# Patient Record
Sex: Female | Born: 1995 | Race: White | Hispanic: No | Marital: Single | Smoking: Never smoker
Health system: Southern US, Community
[De-identification: ages and names within clinical notes are randomized; demographics above are authoritative.]

---
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
## 2017-06-06 ENCOUNTER — Ambulatory Visit: Admitting: Family Medicine

## 2017-12-03 ENCOUNTER — Ambulatory Visit: Admitting: Pediatrics

## 2017-12-07 LAB — HX SEXUALLY TRAN DIS (AMP PRB)
CASE NUMBER: 2018354002739
HX C TRACHOMATIS DNA: NOT DETECTED — NL
HX N. GONORRHOEAE DNA: NOT DETECTED — NL
HX TOTAL RLU: 10

## 2018-05-15 ENCOUNTER — Ambulatory Visit: Admitting: Physician Assistant

## 2018-09-03 ENCOUNTER — Other Ambulatory Visit: Payer: Self-pay | Admitting: Sports Medicine

## 2018-09-03 DIAGNOSIS — S93401S Sprain of unspecified ligament of right ankle, sequela: Secondary | ICD-10-CM

## 2018-09-03 DIAGNOSIS — M25471 Effusion, right ankle: Secondary | ICD-10-CM

## 2018-09-03 DIAGNOSIS — G8929 Other chronic pain: Secondary | ICD-10-CM

## 2018-09-03 DIAGNOSIS — M25571 Pain in right ankle and joints of right foot: Principal | ICD-10-CM

## 2018-09-11 ENCOUNTER — Encounter: Payer: Self-pay | Admitting: Emergency Medicine

## 2018-09-11 ENCOUNTER — Other Ambulatory Visit: Payer: Self-pay

## 2018-09-11 ENCOUNTER — Emergency Department
Admission: EM | Admit: 2018-09-11 | Discharge: 2018-09-11 | Disposition: A | Discharge status: Home / Self Care | Payer: 59 | Attending: Emergency Medicine | Admitting: Emergency Medicine

## 2018-09-11 DIAGNOSIS — Y92002 Bathroom of unspecified non-institutional (private) residence single-family (private) house as the place of occurrence of the external cause: Secondary | ICD-10-CM | POA: Insufficient documentation

## 2018-09-11 DIAGNOSIS — S61512A Laceration without foreign body of left wrist, initial encounter: Secondary | ICD-10-CM

## 2018-09-11 DIAGNOSIS — W268XXA Contact with other sharp object(s), not elsewhere classified, initial encounter: Secondary | ICD-10-CM | POA: Diagnosis not present

## 2018-09-11 DIAGNOSIS — Y999 Unspecified external cause status: Secondary | ICD-10-CM | POA: Insufficient documentation

## 2018-09-11 DIAGNOSIS — Y93E1 Activity, personal bathing and showering: Secondary | ICD-10-CM | POA: Insufficient documentation

## 2018-09-11 NOTE — Discharge Instructions (Addendum)
Please follow-up with [facility] in 7 to 10 days for suture removal.  Return to the emergency department for any signs of infection such as increased pain, pus or fever.

## 2018-09-11 NOTE — ED Notes (Signed)
ED Provider at bedside. 

## 2018-09-11 NOTE — ED Provider Notes (Signed)
[facility] Emergency Department Provider Note  Time seen: 4:28 AM  I have reviewed the triage vital signs and the nursing notes.   HISTORY  Chief Complaint Laceration    HPI Nicole Miranda is a 22 y.o. female with no past medical history who presents to the emergency department with a laceration to her left wrist.  According to the patient she was in the shower when she was trying to wash out her razor accidentally cutting herself in the left wrist patient has approximately 1.5 cm laceration to the left wrist with continued mild venous oozing.  No other lacerations noted.  Patient adamantly denies any SI or HI, states she realizes it is in a suspicious place but states it was 100% an accident.   History reviewed. No pertinent past medical history.  There are no active problems to display for this patient.   History reviewed. No pertinent surgical history.  Prior to Admission medications   Not on File    No Known Allergies  No family history on file.  Social History Social History   Tobacco Use  . Smoking status: Never Smoker  . Smokeless tobacco: Never Used  Substance Use Topics  . Alcohol use: Yes  . Drug use: Never    Review of Systems Constitutional: Negative for fever. Cardiovascular: Negative for chest pain. Respiratory: Negative for shortness of breath. Gastrointestinal: Negative for abdominal pain Skin: 1.5 cm laceration to the left wrist All other ROS negative  ____________________________________________   PHYSICAL EXAM:  VITAL SIGNS: ED Triage Vitals  Enc Vitals Group     BP 09/11/18 0259 121/75     Pulse Rate 09/11/18 0259 (!) 105     Resp 09/11/18 0259 18     Temp 09/11/18 0259 97.9 F (36.6 C)     Temp Source 09/11/18 0259 Oral     SpO2 09/11/18 0259 100 %     Weight 09/11/18 0258 145 lb (65.8 kg)     Height 09/11/18 0258 5\' 7"  (1.702 m)     Head Circumference --      Peak Flow --      Pain Score 09/11/18  0258 2     Pain Loc --      Pain Edu? --      Excl. in GC? --     Constitutional: Alert and oriented. Well appearing and in no distress. Eyes: Normal exam ENT   Head: Normocephalic and atraumatic.   Mouth/Throat: Mucous membranes are moist. Cardiovascular: Normal rate, regular rhythm. No murmur Respiratory: Normal respiratory effort without tachypnea nor retractions. Breath sounds are clear  Gastrointestinal: Soft and nontender. No distention.   Musculoskeletal: Nontender with normal range of motion in all extremities. Neurologic:  Normal speech and language. No gross focal neurologic deficits  Skin: 1.5 cm laceration to the left wrist, minimal venous oozing. Psychiatric: Mood and affect are normal. Speech and behavior are normal.   ____________________________________________   INITIAL IMPRESSION / ASSESSMENT AND PLAN / ED COURSE  Pertinent labs & imaging results that were available during my care of the patient were reviewed by me and considered in my medical decision making (see chart for details).  Patient presents to the emergency department with a laceration of the left wrist.  Patient denies any SI, denies self-mutilation.  No other scars present.  Does not appear suspicious for self-harm.  Patient's tetanus shot is up-to-date.  Laceration repaired by myself with 4 sutures which will be removed in 7 days at [facility] per patient.  LACERATION REPAIR Performed by: Minna Antis Authorized by: Minna Antis Consent: Verbal consent obtained. Risks and benefits: risks, benefits and alternatives were discussed Consent given by: patient Patient identity confirmed: provided demographic data Prepped and Draped in normal sterile fashion Wound explored  Laceration Location: Left wrist  Laceration Length: 2 cm  No Foreign Bodies seen or palpated  Anesthesia: local infiltration  Local anesthetic: lidocaine 1 % without epinephrine  Anesthetic total: 3  ml  Irrigation method: syringe Amount of cleaning: standard  Skin closure: 4-0 Prolene  Number of sutures: 4  Technique: Simple interrupted  Patient tolerance: Patient tolerated the procedure well with no immediate complications.   ____________________________________________   FINAL CLINICAL IMPRESSION(S) / ED DIAGNOSES  Laceration    Minna Antis, MD 09/11/18 [phone]

## 2018-09-11 NOTE — ED Triage Notes (Signed)
Pt arrives ambulatory with steady gait to triage with c/o left wrist laceration. Pt states that she was cleaning her razor in the shower and cut herself on accident. Pt is in NAD.

## 2018-09-16 ENCOUNTER — Ambulatory Visit
Admission: RE | Admit: 2018-09-16 | Discharge: 2018-09-16 | Disposition: A | Discharge status: Home / Self Care | Payer: 59 | Source: Ambulatory Visit | Attending: Sports Medicine | Admitting: Sports Medicine

## 2018-09-16 DIAGNOSIS — X58XXXS Exposure to other specified factors, sequela: Secondary | ICD-10-CM | POA: Insufficient documentation

## 2018-09-16 DIAGNOSIS — M25471 Effusion, right ankle: Secondary | ICD-10-CM | POA: Insufficient documentation

## 2018-09-16 DIAGNOSIS — G8929 Other chronic pain: Secondary | ICD-10-CM | POA: Diagnosis not present

## 2018-09-16 DIAGNOSIS — M25571 Pain in right ankle and joints of right foot: Secondary | ICD-10-CM | POA: Insufficient documentation

## 2018-09-16 DIAGNOSIS — S93401S Sprain of unspecified ligament of right ankle, sequela: Secondary | ICD-10-CM | POA: Insufficient documentation

## 2018-12-03 ENCOUNTER — Ambulatory Visit: Admitting: Pediatrics

## 2018-12-06 LAB — HX SEXUALLY TRAN DIS (AMP PRB)
CASE NUMBER: 2019354003274
HX C TRACHOMATIS DNA: NOT DETECTED — NL
HX N. GONORRHOEAE DNA: NOT DETECTED — NL
HX TOTAL RLU: 7

## 2019-06-04 IMAGING — MR MR ANKLE*R* W/O CM
5 series · 40 of 40 positions shown · non-contrast
Comparison: None.

CLINICAL DATA: Right ankle pain since a twisting injury when the
patient fell down stairs in [REDACTED] Initial encounter.

EXAM:
MRI OF THE RIGHT ANKLE WITHOUT CONTRAST
TECHNIQUE: Multiplanar, multisequence MR imaging of the ankle was performed. No
intravenous contrast was administered.

[Series 3: PD fat-sat · axial · 3.0mm · 0.70mm/px · z∈[-110,+28]mm · 9 of 43 slices shown]
[im 1/43]
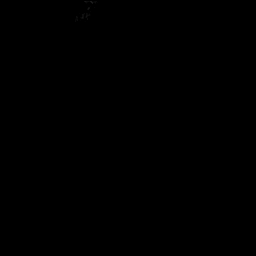
[im 6/43]
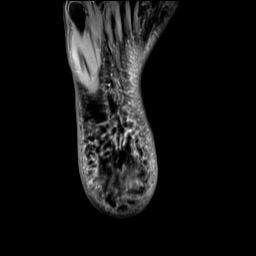
[im 11/43]
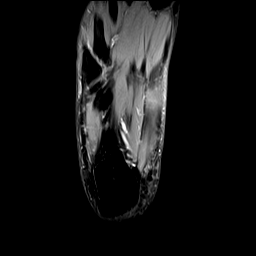
[im 16/43]
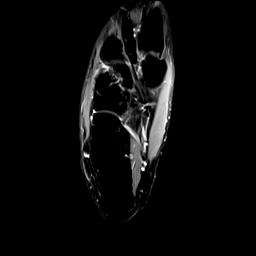
[im 22/43]
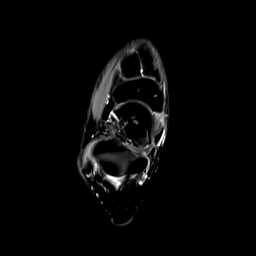
[im 27/43]
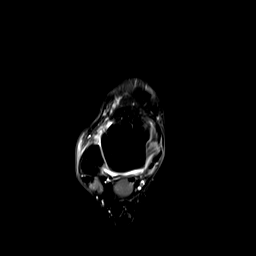
[im 32/43]
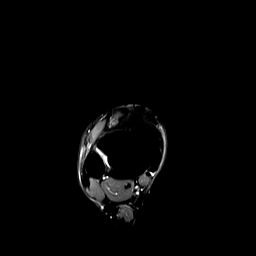
[im 37/43]
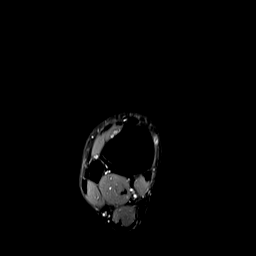
[im 43/43]
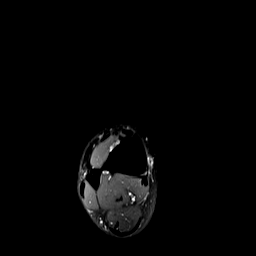

[Series 4: T2 fat-sat · axial · 3.0mm · 0.70mm/px · z∈[-110,+28]mm · 9 of 43 slices shown (1 of 2)]
[im 1/43]
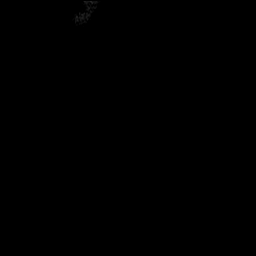
[im 6/43]
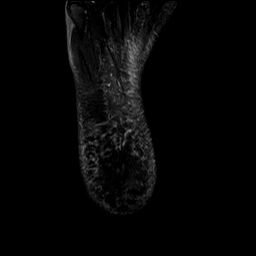
[im 11/43]
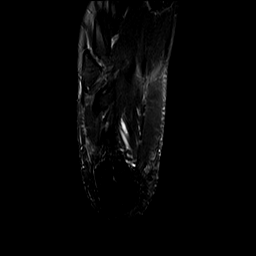
[im 16/43]
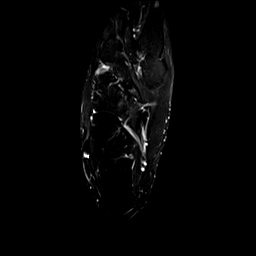
[im 22/43]
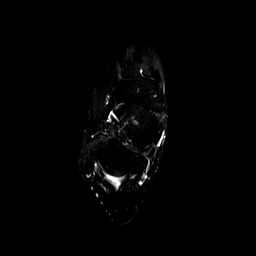
[im 27/43]
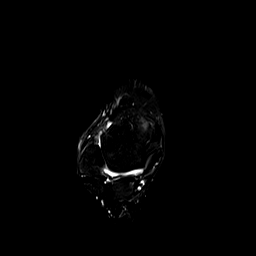
[im 32/43]
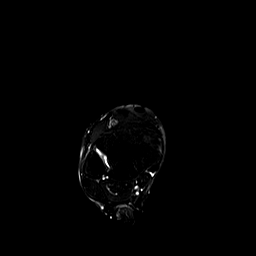
[im 37/43]
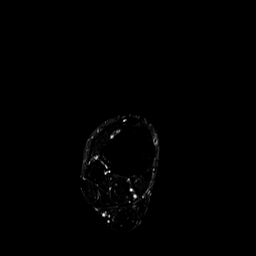
[im 43/43]
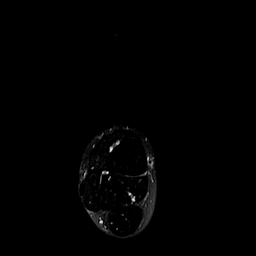

[Series 5: T2 fat-sat · coronal · 3.0mm · 0.70mm/px · 10 of 45 slices shown (2 of 2)]
[im 1/45]
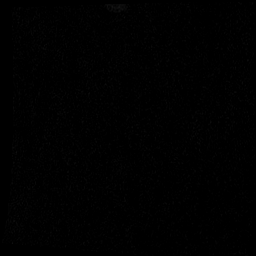
[im 5/45]
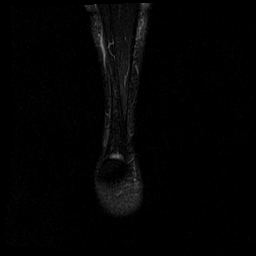
[im 10/45]
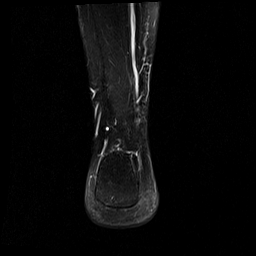
[im 15/45]
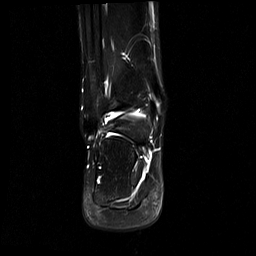
[im 20/45]
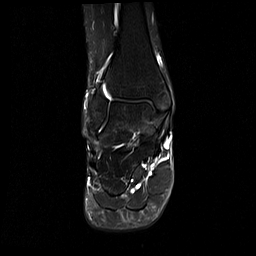
[im 25/45]
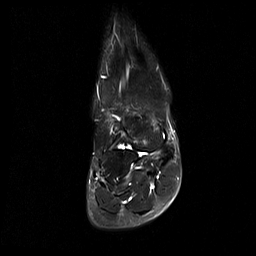
[im 30/45]
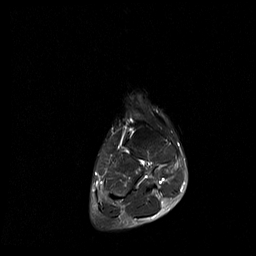
[im 35/45]
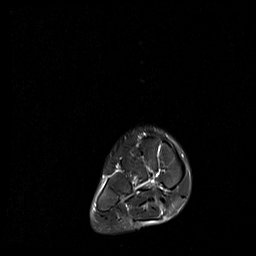
[im 40/45]
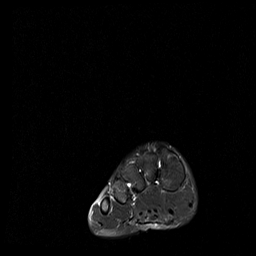
[im 45/45]
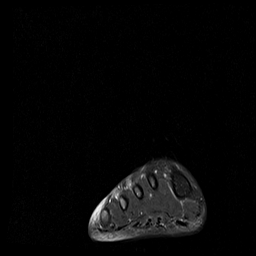

[Series 6: T1 · sagittal · 3.0mm · 0.56mm/px · 6 of 25 slices shown]
[im 1/25]
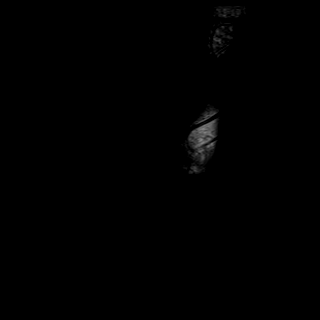
[im 5/25]
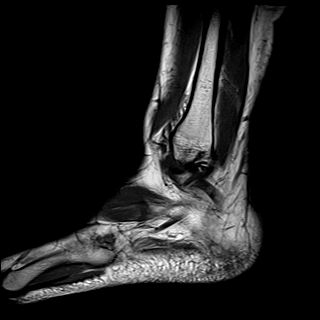
[im 10/25]
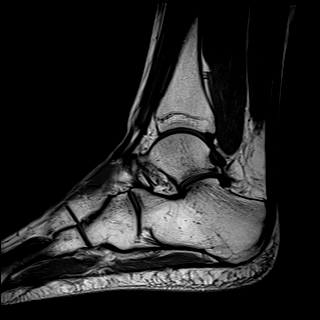
[im 15/25]
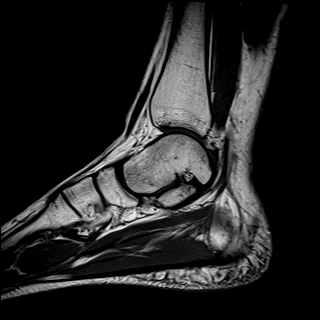
[im 20/25]
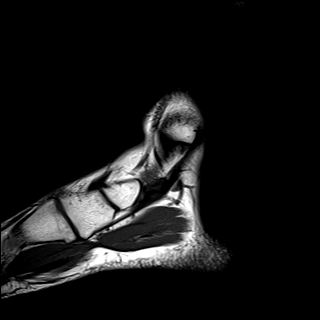
[im 25/25]
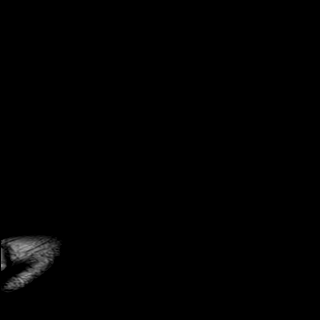

[Series 7: STIR · sagittal · 3.0mm · 0.70mm/px · 6 of 25 slices shown]
[im 1/25]
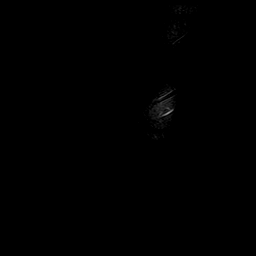
[im 5/25]
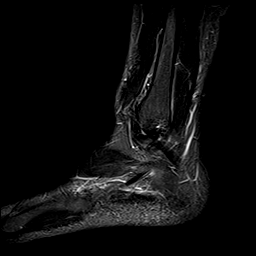
[im 10/25]
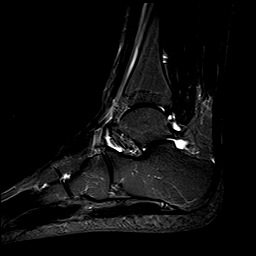
[im 15/25]
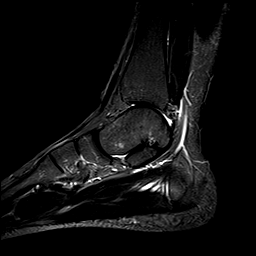
[im 20/25]
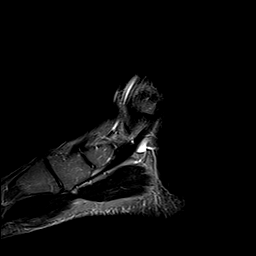
[im 25/25]
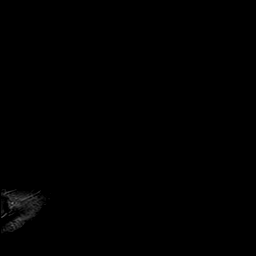

[40 of 40 positions shown; findings below may reference images not displayed]

FINDINGS: TENDONS

Peroneal: Intact.

Posteromedial: Intact.

Anterior: Intact.

Achilles: Intact.

Plantar Fascia: Normal.

LIGAMENTS

Lateral: Intact.

Medial: Intact.

CARTILAGE

Ankle Joint: Normal.

Subtalar Joints/Sinus Tarsi: Normal.

Bones: Normal marrow signal throughout.

Other: None.
IMPRESSION: Normal MRI right ankle.  No finding to explain the patient's pain.

## 2019-07-11 ENCOUNTER — Ambulatory Visit: Admitting: Emergency Medicine

## 2019-07-12 LAB — HX COVID19 BY PCR (LGH)
CASE NUMBER: 2020209002747
HX COVID19 BY PCR: NOT DETECTED

## 2019-09-08 ENCOUNTER — Other Ambulatory Visit: Payer: Self-pay

## 2019-09-08 DIAGNOSIS — Z20822 Contact with and (suspected) exposure to covid-19: Secondary | ICD-10-CM

## 2019-09-09 LAB — NOVEL CORONAVIRUS, NAA: SARS-CoV-2, NAA: NOT DETECTED

## 2019-09-09 LAB — SPECIMEN STATUS REPORT

## 2019-12-05 ENCOUNTER — Ambulatory Visit: Admitting: Emergency Medicine

## 2020-01-23 ENCOUNTER — Ambulatory Visit: Payer: BC Managed Care – PPO | Attending: Internal Medicine

## 2020-01-23 DIAGNOSIS — Z20822 Contact with and (suspected) exposure to covid-19: Secondary | ICD-10-CM

## 2020-01-24 LAB — NOVEL CORONAVIRUS, NAA: SARS-CoV-2, NAA: NOT DETECTED

## 2020-11-26 ENCOUNTER — Ambulatory Visit: Admitting: Family Medicine

## 2020-11-26 LAB — HX .SARS/FLU PCR POC
CASE NUMBER: 2021347003180
HX INFLUENZA A POC: NOT DETECTED
HX INFLUENZA B POC: NOT DETECTED
HX SARS PCR POC: NOT DETECTED

## 2022-12-24 LAB — BMP (EXT)
Anion Gap (EXT): 13 mmol/L (ref 7–17)
BUN (EXT): 11 mg/dL (ref 6–23)
CO2 (EXT): 25 mmol/L (ref 22–31)
CalciumCalcium (EXT): 9.5 mg/dL (ref 8.8–10.7)
Chloride (EXT): 104 mmol/L (ref 98–107)
Creatinine (EXT): 0.85 mg/dL (ref 0.50–1.20)
GFR Estimated (Calc) (EXT): 98 mL/min/{1.73_m2} (ref >59)
Glucose (EXT): 102 mg/dL — ABNORMAL HIGH (ref 70–100)
Potassium (EXT): 3.8 mmol/L (ref 3.4–5.1)
Sodium (EXT): 142 mmol/L (ref 136–145)

## 2023-01-02 ENCOUNTER — Ambulatory Visit
Admit: 2023-01-02 | Discharge: 2023-01-02 | Payer: BLUE CROSS/BLUE SHIELD | Attending: Obstetrics & Gynecology | Primary: Internal Medicine

## 2023-01-02 DIAGNOSIS — Z01411 Encounter for gynecological examination (general) (routine) with abnormal findings: Secondary | ICD-10-CM

## 2023-01-02 MED ORDER — norethindrone (Micronor) 0.35 mg tablet
0.35 | ORAL_TABLET | Freq: Every day | ORAL | 4 refills | 84.00000 days | Status: AC
Start: 2023-01-02 — End: ?

## 2023-01-02 NOTE — Progress Notes (Signed)
[facility]  [address]  [address]  [place]. [place] [address]  Dept: [phone]  Dept Fax: [phone]     Patient ID: Sheila Rogers is a 27 y.o. female who presents for new patient.    Subjective   HPI     Sheila Rogers is a 28 yo G0 here to establish care - needs first pap smear, and f/u of pelvic pain.  3 weeks ago started acute pain while in [place] - seen in urgent care and had presumed UTI and started on macrobid.  Then seen in ED and had presumed PID - negative STI panel - she is currently finishing antibiotics of flagyl and doxycycline. Pain is getting better lower bilateral quadrants R>L, taking motrin/tylenol prn, mostly constant.   No vaginal itching or discharge, no diarrhea  Some GI upset from abx  Reports heavy menses, regular cycle. She previously was on OCPs as a teen and caused migraines   Previously sexually active, not currently.  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 No tobacco or drug use, social etoh. + exercise     There is no problem list on file for this patient.    Current Outpatient Medications   Medication Instructions   . citalopram (CeleXA) 5 MG split tablet 0 Refill(s)   . metroNIDAZOLE (Flagyl) 500 mg tablet See admin instructions   . norethindrone (MICRONOR) 0.35 mg, oral, Daily     No Known Allergies  History reviewed. No pertinent past medical history.  History reviewed. No pertinent surgical history.  No family history on file.  Social History     Tobacco Use   . Smoking status: Never   . Smokeless tobacco: Never   Vaping Use   . Vaping Use: Never used   Substance Use Topics   . Alcohol use: Yes   . Drug use: Never       Objective   Visit Vitals  BP 110/64   Wt 69.4 kg   BMI 23.96 kg/m   BSA 1.81 m       Physical Exam  Vitals reviewed. Exam conducted with a chaperone present.   Constitutional:       General: She is not in acute distress.     Appearance: Normal appearance.   Pulmonary:       Effort: Pulmonary effort is normal.   Chest:      Chest wall: No mass, deformity or tenderness.   Breasts:     Right: Normal. No mass, nipple discharge, skin change or tenderness.      Left: Normal. No mass, nipple discharge, skin change or tenderness.   Abdominal:      General: There is no distension.      Palpations: Abdomen is soft.   Genitourinary:     General: Normal vulva.      Vagina: Normal.      Cervix: Normal.      Uterus: Normal.       Adnexa: Right adnexa normal and left adnexa normal.      Comments: No CMT, normal discharge, no adnexal masses or tenderness, no abdominal wall tenderness  Lymphadenopathy:      Upper Body:      Right upper body: No axillary adenopathy.      Left upper body: No axillary adenopathy.   Skin:     General: Skin is warm.   Neurological:      Mental Status:  She is alert.   Psychiatric:         Attention and Perception: Attention normal.         Assessment/Plan   Sheila Rogers was seen today for new patient.  Encounter for gynecological examination with abnormal finding  -     Pap Smear with Reflex to Aptima HPV  Menorrhagia with regular cycle  -     norethindrone (Micronor) 0.35 mg tablet; Take 1 tablet (0.35 mg) by mouth once daily.    - pap with reflex HPV sent - if normal repeat in 3 years, if abnormal will call and discuss plan  - start progesterone only OCPs due to her previous side effects, reviewed possible AUB, take at same time daily, will start with next menses due next week  - reviewed pelvic pain, reviewed unlikely ovarian in nature as normal nontender ovaries, had normal CT scan.  Reviewed less likely PID As she had negative cultures, she can finish abx and monitor sx reviewed ddx also includes endometriosis however is not often acute pain - will see if sx improve with progesterone pills    Return in 1 year or sooner as needed

## 2023-01-06 LAB — PAP WITH REFLEX TO APTIMA HPV: Cyto Comments: 0

## 2023-01-29 ENCOUNTER — Ambulatory Visit
Admit: 2023-01-29 | Discharge: 2023-01-29 | Payer: BLUE CROSS/BLUE SHIELD | Attending: Specialist | Primary: Internal Medicine

## 2023-01-29 DIAGNOSIS — R102 Pelvic and perineal pain: Secondary | ICD-10-CM

## 2023-01-29 NOTE — Progress Notes (Signed)
[facility]  [address]  [place]  [place] [address]  Dept: [phone]  Dept Fax: [phone]     Patient ID: Sheila Rogers is a 27 y.o. who presents for followup of pelvic pain    Subjective: Pt describes intermittent stabbing pelvic pain B sides just below umbilicus.  She states that it occurs daily, does not seem to have a pattern. Not associated with ovulation or menses.  She denies N/V/D/bloating/dysuria    ROS: Negative, other than pertinent pos/neg sx above  Problems:   Patient Active Problem List   Diagnosis   . Dermatosis   . Hidradenitis suppurativa     Current Medications:   Current Outpatient Medications   Medication Instructions   . norethindrone (MICRONOR) 0.35 mg, oral, Daily     Allergies: No Known Allergies  Past Medical History: History reviewed. No pertinent past medical history.  Past Surgical History: History reviewed. No pertinent surgical history.  Family History: No family history on file.  Social History:   Social History     Tobacco Use   . Smoking status: Never   . Smokeless tobacco: Never   Vaping Use   . Vaping Use: Never used   Substance Use Topics   . Alcohol use: Yes   . Drug use: Never       Physical Exam:  BP 110/78   Wt 72.5 kg   LMP 01/13/2023 (Exact Date)   BMI 25.03 kg/m   Pleasant woman in NAD  HENT: Normocephalic, atraumatic   Lungs: Normal respiratory effort  Genitourinary:  Vulva normal  VAgina no abnl d/c  Cervix no CMT  Uterus nontender  Adnexae nontender, not identified in location of sx, does not reproduce pain  Skin: Warm and dry.   Neurological: No gross neurological findings  Psychiatric:    Normal affect and thought process    Assessment/Plan:  27 y.o. G0P0000 woman who presents for followup of pelvic pain, seen in [facility] ED for intermittent stabbing pain and treated for PID. CT was neg for appy and no GYN pathology was noted.   She was then seen  by Dr. Malva Limes on 12/26/22 and it was felt at that time that pain was improving.  After discussion, pt opted to start Micronor and has had no bleeding since starting it, but has continued to have pain.  She states there has not appeared to be any pattern or cycle to the pain, no abnl d/c or bleeding.  There has been  No change with either Abx for PID or with starting POP. On exam, she states that the pain is not reproducible with palpation of the ovaries and that the ovaries are, in fact, lower in the pelvis than the location of the pain, which is just below the umbilicus.  Will obtain U/S as it provides better imaging of the GYN organs, but the clinical picture is more c/w a different etiology of her pain than GYN.    PCP information given as she has recently returned to [place] from [place] and has not yet chosen a PCP  Genprobe was negative at ED visit and pap normal at Jan visit. She was last sexuallly active more than 69mo ago.

## 2023-02-04 ENCOUNTER — Inpatient Hospital Stay: Admit: 2023-02-04 | Discharge: 2023-02-04 | Payer: BLUE CROSS/BLUE SHIELD | Primary: Internal Medicine

## 2023-02-04 DIAGNOSIS — R102 Pelvic and perineal pain: Secondary | ICD-10-CM

## 2023-03-04 ENCOUNTER — Ambulatory Visit: Admit: 2023-03-04 | Discharge: 2023-03-04 | Payer: BLUE CROSS/BLUE SHIELD | Primary: Internal Medicine

## 2023-03-04 DIAGNOSIS — Z7689 Persons encountering health services in other specified circumstances: Secondary | ICD-10-CM

## 2023-03-04 DIAGNOSIS — F411 Generalized anxiety disorder: Secondary | ICD-10-CM

## 2023-03-04 MED ORDER — FLUoxetine (PROzac) 20 mg tablet
20 | ORAL_TABLET | Freq: Every day | ORAL | 1 refills | Status: DC
Start: 2023-03-04 — End: 2023-03-06

## 2023-03-04 NOTE — Progress Notes (Signed)
Chief Complaint:  Chief Complaint   Patient presents with   . new patient           Subjective:     Sheila Rogers is a 27 y.o. female who is here today to establish care and for her annual comprehensive exam.   Has not had a physical in 2 years.  Goes to dentist bi-annually, wears glasses, does not go to optometrist regularly.     Patient is followed by rheumatology, dermatology, and GYN.     1) Current concerns/Follow-up:  - Menses/birth control: 02/27/23  No LMP recorded. (Menstrual status: Continuous Birth Control). Pap up to date   2) Increase in depression and anxiety symptoms. Previously on prozac with good effect, willing to restart medication.  3) Lower abdominal pain. Started in December, worked up by GYN negative for PID. Pain resolved 2 weeks ago.   Review Of Systems    Constitutional: Negative for fevers, chills, fatigue, unexpected weight changes.  HEENT: Negative for visual changes, oral lesions, sore throat, epistaxis, rhinorrhea.  Cardiovascular: Negative for chest pain, DOE, palpitations, lower extremity edema.  Respiratory: Negative for dyspnea, cough, or hemoptysis.  Gastrointestinal: Negative for nausea, vomiting, abdominal pain, change in bowel habits, blood or melena.  Genitourinary: Negative for dysuria, hematuria, frequency, genital discharge/lesions.  Breast: Negative for rash, asymmetry, lumps, pain, nipple discharge.  Musculoskeletal: Negative for muscle pain/weakness, joint pain/swelling, decreased range of motion.  Integumentary: Negative for rashes, pruritus, lesions or masses.   Neurological: Negative for headaches, dizziness, numbness, tingling.  Psychiatric: Negative for depressed mood, insomnia, decreased appetite.  Endocrine: Negative for polyuria, polydipsia, weight change, temperature intolerance, hair loss.  Hematologic/Lymphatic: Negative for easy bleeding or bruising, swelling of the extremities.      Current Outpatient Medications   Medication Sig Dispense Refill   .  norethindrone (Micronor) 0.35 mg tablet Take 1 tablet (0.35 mg) by mouth once daily. 84 tablet 4   . clobetasol (Temovate) 0.05 % cream Apply topically twice daily for 14 days. Apply to fingers twice a day for two weeks. Then every other day as needed. 60 g 0   . FLUoxetine (PROzac) 20 mg tablet Take 1 tablet (20 mg) by mouth once daily. 30 tablet 1     No current facility-administered medications for this visit.       Allergies: Patient has no known allergies.    Past Medical History:   Diagnosis Date   . Dermatosis    . Hidradenitis suppurativa    . Raynaud's phenomenon        History reviewed. No pertinent surgical history.    Family History   Problem Relation Name Age of Onset   . Diabetes Father     . Asthma Brother     . Diabetes Maternal Grandmother     . Heart disease Maternal Grandfather           Objective:     Patient Health Questionnaire-9 Score: 7   Interpretation: Positive screening.     Follow-up & Interventions: Referral to behavioral health resources     GAD-7  Feeling Nervous, Anxious, or on Edge: Nearly every day  Not Being Able to Stop or Control Worrying: Nearly every day  Worrying too Much About Different Things: Nearly every day  Trouble Relaxing: Nearly every day  Being so Restless That it is Hard to Sit Still: More than half the days  Becoming Easily Annoyed or Irritable: Several days  Feeling Afraid as if Something Awful Might Happen:  More than half the days  GAD-7 Total Score: 17  If you checked off any problems, how difficult have these problems made it for you to do your work, take care of things at home, or get along with other people?: Somewhat difficult        Previous pertinent lab results:  No results found for: "CHOL", "HDL", "LDL", "DIRECTLDL", "TRIG"   No results found for: "WBC", "HGB", "HCT", "PLTCOUNTQUES", "INR"  No results found for: "NA", "K", "CL", "BUN", "CREATINE", "EGFR", "GLU", "CALCIUM", "AST", "ALT", "ALKPHOS", "PROT", "ALBUMIN", "TOTBILIRUB"  No results found for:  "HGBA1C", "TSH", "FREET4", "MICROALBUR", "VITD25OH", "PSA"      Vitals:    03/04/23 1527   BP: 114/80   BP Location: Left arm   Patient Position: Sitting   Pulse: 83   SpO2: 99%   Weight: 72.6 kg   Height: 1.702 m     Body mass index is 25.05 kg/m.    Physical Exam:  General Appearance: Pleasant, no acute distress, well developed, well nourished, facial asymmetry due to birth trauma.  HEENT: Head is atraumatic and normocephalic. EOMI. Sclerae are anicteric. Conjunctivae are not injected.  Neck: Supple, no adenopathy, no thyromegaly  Back: FROM  Lungs: clear to auscultation bilaterally , no wheezes/rhonchi/rales , regular breathing rate and effort  Heart: Regular rate and rhythm, S1 and S2 normal, no murmur, rub or gallop  Breast Exam: deferred exam, performed at annual GYN visit  Abdomen: Soft, non-tender, bowel sounds active all four quadrants, no masses, no organomegaly  Genitalia: deferred exam, followed by GYN  Extremities:  no cyanosis or edema  Pulses: 2+ and symmetric throughout  Skin: Turgor normal, no rashes. Erythematous papules present on fingers.  Neurologic: A+Ox3, CNII-XII grossly intact, normal strength, sensation and reflexes throughout      Assessment and Plan:     Sheila Rogers was seen today for new patient.    Diagnoses and all orders for this visit:    Encounter to establish care  Comments:  office locations and care teams reviewed. portal use discussed. lab sites in office and outpatient discussed.     Encounter for annual physical exam  Comments:  Will request immunization records. RHCM discussed.balanced diet, regular excercise, yearly eye exam recommended. protected sex recommended.  Orders:  -     Comprehensive metabolic panel  -     Lipid panel  -     GC/CT (Amp DNA); Future  -     GC/CT (Amp DNA)  -     Referral to Behavioral Health Teletherapy/[facility]; Future    GAD (generalized anxiety disorder)  Comments:  referral to BHIP placed. self care encouraged. RX sent to pharmacy. Follow up  in 1 month to reassess.   Orders:  -     FLUoxetine (PROzac) 20 mg tablet; Take 1 tablet (20 mg) by mouth once daily.    Mild depressive disorder  Comments:  denies s/i h/i avh. previously on prozac. willing to start medication and referral to BHIP placed. potential med side effects reviewed with patient f/u 3 weeks.    Facial palsy as birth trauma  Comments:  chronic facial asymmetry present.     Dermatosis  Comments:  seen by dermatology in the past.   Orders:  -     clobetasol (Temovate) 0.05 % cream; Apply topically twice daily for 14 days. Apply to fingers twice a day for two weeks. Then every other day as needed.    Raynaud's disease without gangrene  Comments:  preventative care discussed. keep extremities warm and prevent cold exposure.      Lower abdominal pain  Comments:  currently resolved. no known etiology, treated for PID with dual antibiotic therapy after ED visit. worked up by GYN negative. Will monitor, connect if recurs         Preventive Counseling Discussed:  --Nutrition/Weight Management: discussed caffeine intake, sufficient intake of fresh fruits, vegetables  --Exercise: discussed importance of regular exercise.   --Substance Abuse: Discussed cessation/primary prevention of tobacco, alcohol, or other drug use;   --Injury prevention: Discussed safety belts, safety helmets,  --Dental health: Discussed importance of regular  dental visits. As well as eye exams   --Immunizations reviewed.  Discussed behaviors, sleep, mental health  Knows we will connect via the portal for any testing and lab follow up  Follow-Up:  Follow up in about 3 weeks (around 03/25/2023).        Lynnell Chad, NP

## 2023-03-05 MED ORDER — clobetasol (Temovate) 0.05 % cream
0.05 | Freq: Two times a day (BID) | TOPICAL | 0 refills | 15.00000 days | Status: AC
Start: 2023-03-05 — End: 2023-03-19

## 2023-03-05 NOTE — Telephone Encounter (Signed)
Last Visit:  03/04/2023       Next Visit: 03/25/2023        Last Labs: 03/04/2023 needs to be done       Last Filled:03/04/2023  Insurance does not cover tabs                citalopram (CeleXA) 20 mg tablet         Changed from: FLUoxetine (PROzac) 20 mg tablet    All pharmacy suggested alternatives are listed below    Sig: N/A    Disp:  Not specified  Refills:  0    Start: 03/04/2023    Class: Normal    For: GAD (generalized anxiety disorder)    Last ordered: Yesterday (03/04/2023) by Lynnell Chad, NP    Last refill: 03/04/2023    Rx #: [id number]    Pharmacy comment: Alternative Requested:TABLETS NOT COVERED BY INSURANCE.   All Pharmacy Suggested Alternatives:    0 citalopram (CeleXA) 20 mg tablet  0 FLUoxetine (PROzac) 20 mg capsule  0 sertraline (Zoloft) 50 mg tablet  0 escitalopram (Lexapro) 10 mg tablet  0 PARoxetine (Paxil) 20 mg tablet   To prescribe one of the alternatives listed above, open the encounter and click Replace. Open Encounter    Citalopram (Celexa) Protocol Failed 03/04/2023 05:05 PM   Protocol Details  EKG within 1 year    Active on medication list    Citalopram dose less than or equal to 40mg " for age </= 25yo"     Visit with relevant provider in past 12 months or upcoming 90 days    Medication not refilled in past 45 days (1.5 months)      This request has changes from the previous prescription.

## 2023-03-06 LAB — GC/CT (AMP DNA)
CHLAMYDIA TRACHOMATIS RNA, TMA, UROGENITAL: NOT DETECTED
NEISSERIA GONORRHOEAE RNA, TMA, UROGENITAL: NOT DETECTED

## 2023-03-10 NOTE — Progress Notes (Signed)
[facility] completed outreach to engage in counseling services. Participant was unresponsive after three attempts, referral will be closed at this time. Please feel free to share our number [phone] if they wish to schedule an appointment.    Thank you!

## 2023-03-12 MED ORDER — FLUoxetine (PROzac) 10 mg capsule
10 | ORAL_CAPSULE | Freq: Every day | ORAL | 0 refills | Status: DC
Start: 2023-03-12 — End: 2023-04-01

## 2023-03-12 NOTE — Telephone Encounter (Signed)
Pt had side effects with celexa, willing to start prozac RX sent to pharmacy.

## 2023-03-25 ENCOUNTER — Encounter: Payer: BLUE CROSS/BLUE SHIELD | Primary: Internal Medicine

## 2023-04-01 ENCOUNTER — Ambulatory Visit: Admit: 2023-04-01 | Discharge: 2023-04-01 | Payer: BLUE CROSS/BLUE SHIELD | Primary: Internal Medicine

## 2023-04-01 DIAGNOSIS — F411 Generalized anxiety disorder: Secondary | ICD-10-CM

## 2023-04-01 MED ORDER — FLUoxetine (PROzac) 20 mg tablet
20 | ORAL_TABLET | Freq: Every day | ORAL | 1 refills | Status: DC
Start: 2023-04-01 — End: 2023-04-07

## 2023-04-01 NOTE — Telephone Encounter (Signed)
LM- see message from pharmacy regarding medication not covered by the insurance. Alternatives given. Thanks

## 2023-04-01 NOTE — Progress Notes (Signed)
[facility]  [facility]  [address]  [place] [address]  Dept: [phone]  Dept Fax: [phone]     Patient ID: ARIBA LEHNEN is a 27 y.o. female who presents for Follow-up.    Subjective   HPI  27 y.o. with medical history as below presents today for mood follow up. Started on prozac for increased anxiety and mild depressive episode at physical exam. Feels mood has slightly improved on medication, interested in increasing dose.   Denies s/i h/i avh. No other concerns. Reminded to get annual labs drawn.   Patient Active Problem List   Diagnosis   . Dermatosis   . Hidradenitis suppurativa   . Facial palsy as birth trauma   . Raynaud's disease without gangrene   . GAD (generalized anxiety disorder)   . Mild depressive disorder     Current Outpatient Medications   Medication Instructions   . FLUoxetine (PROZAC) 20 mg, oral, Daily   . norethindrone (MICRONOR) 0.35 mg, oral, Daily     Allergies   Allergen Reactions   . Celexa [Citalopram]      Feelings of slowness, increased depression and anxiety.     Past Medical History:   Diagnosis Date   . Dermatosis    . Hidradenitis suppurativa    . Raynaud's phenomenon      No past surgical history on file.  Family History   Problem Relation Name Age of Onset   . Diabetes Father     . Asthma Brother     . Diabetes Maternal Grandmother     . Heart disease Maternal Grandfather       Social History     Tobacco Use   . Smoking status: Never   . Smokeless tobacco: Never   Vaping Use   . Vaping Use: Never used   Substance Use Topics   . Alcohol use: Yes     Comment: 1-2 beers during the week   . Drug use: Never       Objective   Visit Vitals  BP 110/70 (BP Location: Right arm, Patient Position: Sitting)   Pulse 78   Ht 1.702 m   Wt 62.1 kg   SpO2 99%   BMI 21.45 kg/m   OB Status Continuous Birth Control   BSA 1.71 m     Over the past 2 weeks, how often have you been bothered by any of the following problems?  Little interest or pleasure in doing  things: Not at all  Feeling down, depressed, or hopeless: Several days  Patient Health Questionnaire-2 Score: 1    Over the past 2 weeks, how often have you been bothered by any of the following problems?  Trouble falling or staying asleep, or sleeping too much: Not at all  Feeling tired or having little energy: Not at all  Poor appetite or overeating: Not at all  Feeling bad about yourself - or that you are a failure or have let yourself or your family down: Several days  Trouble concentrating on things, such as reading the newspaper or watching television: Not at all  Moving or speaking so slowly that other people could have noticed? Or the opposite - being so fidgety or restless that you have been moving around a lot more than usual.: Not at all  Thoughts that you would be better off dead or hurting yourself in some way: Not at all  Patient Health Questionnaire-9 Score: 2      GAD-7  Feeling Nervous, Anxious, or on Edge: More than half the days  Not Being Able to Stop or Control Worrying: More than half the days  Worrying too Much About Different Things: More than half the days  Trouble Relaxing: Several days  Being so Restless That it is Hard to Sit Still: Several days  Becoming Easily Annoyed or Irritable: Several days  Feeling Afraid as if Something Awful Might Happen: Several days  GAD-7 Total Score: 10  If you checked off any problems, how difficult have these problems made it for you to do your work, take care of things at home, or get along with other people?: Somewhat difficult        Physical Exam  Vitals reviewed.   Neurological:      Mental Status: She is alert.   Psychiatric:         Mood and Affect: Mood normal.         Behavior: Behavior normal.         Thought Content: Thought content normal.         Judgment: Judgment normal.         Assessment/Plan   Sundeep was seen today for follow-up.  GAD (generalized anxiety disorder)  Comments:  denies current s/i h/i avh. increase dose. will connect via  portal in 2 weeks to assess, f/u in 3 months via telehealth.   Orders:  -     FLUoxetine (PROzac) 20 mg tablet; Take 1 tablet (20 mg) by mouth once daily.      Follow up in three month for telehealth to reassess mood.

## 2023-04-02 LAB — COMPREHENSIVE METABOLIC PANEL
A/G Ratio: 1.4 (calc) (ref 1.0–2.5)
ALT: 9 U/L (ref 6–29)
AST: 14 U/L (ref 10–30)
Albumin: 4.1 g/dL (ref 3.6–5.1)
Alkaline Phosphatase: 58 U/L (ref 31–125)
Bilirubin, total: 0.6 mg/dL (ref 0.2–1.2)
CALCIUM: 9 mg/dL (ref 8.6–10.2)
CARBON DIOXIDE: 27 mmol/L (ref 20–32)
CHLORIDE: 105 mmol/L (ref 98–110)
CREATININE: 0.67 mg/dL (ref 0.50–0.96)
EGFR: 125 mL/min/{1.73_m2} (ref >=60)
GLUCOSE: 78 mg/dL (ref 65–99)
Globulin, Total: 2.9 g/dL (ref 1.9–3.7)
POTASSIUM: 4.7 mmol/L (ref 3.5–5.3)
Protein, total: 7 g/dL (ref 6.1–8.1)
SODIUM: 137 mmol/L (ref 135–146)
UREA NITROGEN (BUN): 11 mg/dL (ref 7–25)

## 2023-04-02 LAB — LIPID PANEL
CHOL/HDLC RATIO: 2.5 (calc) (ref <5.0)
CHOLESTEROL, TOTAL: 136 mg/dL (ref <200)
EXT NON HDL CHOLESTEROL: 82 mg/dL (ref <130)
HDL CHOLESTEROL: 54 mg/dL (ref >=50)
LDL-CHOLESTEROL: 69 mg/dL
TRIGLYCERIDES: 46 mg/dL (ref <150)

## 2023-04-06 NOTE — Telephone Encounter (Signed)
Please call pt. everything all set? For her psych med script

## 2023-04-07 MED ORDER — FLUoxetine (PROzac) 20 mg tablet
20 | ORAL_TABLET | Freq: Every day | ORAL | 1 refills | Status: DC
Start: 2023-04-07 — End: 2023-05-01

## 2023-04-07 NOTE — Telephone Encounter (Signed)
Spoke to patient she prefers not to switch at this time and for [place] to refill the fluoxetine. She states we were going to increase the dose ?

## 2023-04-07 NOTE — Telephone Encounter (Signed)
Per patient she said last time she picked up script it was cheap. $1.00 and some change. She stated if the new dose is to expensive she will reach out to [place]

## 2023-04-07 NOTE — Telephone Encounter (Signed)
LVM to call back office..

## 2023-05-01 NOTE — Telephone Encounter (Signed)
Last Visit:       04/01/23   Next Visit:      07/01/23    Last Labs:       Last Filled:          04/07/23 30x1     Patient pays out of pocket     31.78 for 365 tabs   BIN [id number]  PCN [id number]  Group [id number]  Member ID [id number]

## 2023-05-15 ENCOUNTER — Encounter: Payer: BLUE CROSS/BLUE SHIELD | Attending: Family | Primary: Internal Medicine

## 2023-07-01 ENCOUNTER — Telehealth: Admit: 2023-07-01 | Discharge: 2023-07-01 | Payer: BLUE CROSS/BLUE SHIELD | Primary: Internal Medicine

## 2023-07-01 DIAGNOSIS — F411 Generalized anxiety disorder: Secondary | ICD-10-CM

## 2023-07-01 MED ORDER — FLUoxetine (PROzac) 10 mg capsule
10 | ORAL_CAPSULE | Freq: Every day | ORAL | 0 refills | Status: DC
Start: 2023-07-01 — End: 2023-08-11

## 2023-07-01 NOTE — Progress Notes (Signed)
[facility]  [facility]  [address]  [place] [address]  Dept: [phone]  Dept Fax: [phone]     Patient ID: Sheila Rogers is a 27 y.o. female who presents for Anxiety.    Subjective   HPI  27 y.o. with medical history as below presents today for mood follow up. Reports depression symptoms have improved with medication increase. Anxiety feels more manageable but still effecting daily life. Interested in increasing prozac dose. Not interested in therapy at this time. Denies panic attacks with current anxiety. Denies current s/i h/i avh. Has good support system. Medications confirmed.     Patient Active Problem List   Diagnosis    Dermatosis    Hidradenitis suppurativa    Facial palsy as birth trauma    Raynaud's disease without gangrene    GAD (generalized anxiety disorder)    Mild depressive disorder     Current Outpatient Medications   Medication Instructions    FLUoxetine (PROZAC) 20 mg, oral, Daily    FLUoxetine (PROZAC) 10 mg, oral, Daily    norethindrone (MICRONOR) 0.35 mg, oral, Daily     Allergies   Allergen Reactions    Celexa [Citalopram]      Feelings of slowness, increased depression and anxiety.     Past Medical History:   Diagnosis Date    Dermatosis     Hidradenitis suppurativa     Raynaud's phenomenon      No past surgical history on file.  Family History   Problem Relation Name Age of Onset    Diabetes Father      Asthma Brother      Diabetes Maternal Grandmother      Heart disease Maternal Grandfather       Social History     Tobacco Use    Smoking status: Never    Smokeless tobacco: Never   Vaping Use    Vaping Use: Never used   Substance Use Topics    Alcohol use: Yes     Comment: 1-2 beers during the week    Drug use: Never       Objective   Visit Vitals  OB Status Continuous Birth Control     Over the last 2 weeks, how often have you been bothered by the following problems?  Feeling Nervous, Anxious, or on Edge: More than half the days  Not Being Able to Stop  or Control Worrying: More than half the days  Worrying too Much About Different Things: More than half the days  Trouble Relaxing: More than half the days  Being so Restless That it is Hard to Sit Still: Several days  Becoming Easily Annoyed or Irritable: Several days  Feeling Afraid as if Something Awful Might Happen: Several days  GAD-7 Total Score: 11  If you checked off any problems, how difficult have these problems made it for you to do your work, take care of things at home, or get along with other people?: Somewhat difficult    Over the past 2 weeks, how often have you been bothered by any of the following problems?  Little interest or pleasure in doing things: Not at all  Feeling down, depressed, or hopeless: Not at all  Patient Health Questionnaire-2 Score: 0    Over the past 2 weeks, how often have you been bothered by any of the following problems?  Trouble falling or staying asleep, or sleeping too much: Several days  Feeling tired or having little energy:  Several days  Poor appetite or overeating: Not at all  Feeling bad about yourself - or that you are a failure or have let yourself or your family down: Several days  Trouble concentrating on things, such as reading the newspaper or watching television: Not at all  Moving or speaking so slowly that other people could have noticed? Or the opposite - being so fidgety or restless that you have been moving around a lot more than usual.: Not at all  Thoughts that you would be better off dead or hurting yourself in some way: Not at all  Patient Health Questionnaire-9 Score: 3          Physical Exam  Constitutional:       Appearance: Normal appearance.      Comments: Limited exam due to telehealth encounter.   Neurological:      Mental Status: She is alert.         Assessment/Plan   Sheila Rogers was seen today for anxiety.  GAD (generalized anxiety disorder)  Comments:  denies current s/i h/i avh. increase dose. will connect via telehealth in 1 month to  reassess. will connect sooner via portal if side effects occur.  Orders:  -     FLUoxetine (PROzac) 10 mg capsule; Take 1 capsule (10 mg) by mouth once daily.    Follow up in 1 month via telehealth. Will reach out sooner if side effects occur.     Patient Health Questionnaire-9 Score: 3   Interpretation: Negative screening.      Follow-up & Interventions: Maintain annual screening - No additional Follow-up required    This real-time, interactive virtual Telehealth encounter was done by video with the patient's verbal consent. Two patient identifiers were used and confirmed. Physical location of the patient: other. Patient resides in: [place]  Physical location of the provider: office. Other participants/involvement: none  Total minutes spent: 20

## 2023-07-08 ENCOUNTER — Ambulatory Visit: Admit: 2023-07-08 | Discharge: 2023-07-08 | Payer: BLUE CROSS/BLUE SHIELD | Primary: Internal Medicine

## 2023-07-08 DIAGNOSIS — R102 Pelvic and perineal pain: Secondary | ICD-10-CM

## 2023-07-08 LAB — POCT URINALYSIS DIPSTICK
Bilirubin, UA, POC: NEGATIVE
Blood, UA, POC: NEGATIVE
Glucose, UA, POC: NEGATIVE
Ketones, UA, POC: NEGATIVE
Nitrite, UA, POC: NEGATIVE
Spec Gravity, UA, POC: <=1.005 (ref 1.001–1.035)
Urobilinogen, UA, POC: 0.2 — AB
pH, UA, POC: 6.5 (ref 5.0–8.0)

## 2023-07-08 NOTE — Progress Notes (Addendum)
[facility]  [facility]  [address]  [place] [address]  Dept: [phone]  Dept Fax: [phone]     Patient ID: Sheila Rogers is a 27 y.o. female who presents for Abdominal Pain.    Subjective   HPI  27 y.o. with medical history as below presents today for 2 weeks of lower abdominal discomfort. Describes the pain as a sharp and "gnawing" sensation. Present throughout the day, varies in intensity. Bowel patterns have been normal, passing gas. Denies nausea, vomiting, fever, diarrhea and blood in stool. Has taken Advil for current symptom with some relief.   Experienced similar symptoms 6 months ago, went to ED, treated for PID and follow up with GYN advised.     Also, increase in urinary frequency and urgency. Denies hematuria and dysuria.   Menstrual cycle has been irregular since starting OCP. Followed by GYN for incidental finding of right ovarian cyst on transvaginal US 02/10/2026. GYN recommended repeat imaging in 6 weeks, never performed.     Medications confirmed.   Patient Active Problem List   Diagnosis    Dermatosis    Hidradenitis suppurativa    Facial palsy as birth trauma    Raynaud's disease without gangrene    GAD (generalized anxiety disorder)    Mild depressive disorder     Current Outpatient Medications   Medication Instructions    FLUoxetine (PROZAC) 20 mg, oral, Daily    FLUoxetine (PROZAC) 10 mg, oral, Daily    norethindrone (MICRONOR) 0.35 mg, oral, Daily     Allergies   Allergen Reactions    Celexa [Citalopram]      Feelings of slowness, increased depression and anxiety.     Past Medical History:   Diagnosis Date    Dermatosis     Hidradenitis suppurativa     Raynaud's phenomenon      No past surgical history on file.  Family History   Problem Relation Name Age of Onset    Diabetes Father      Asthma Brother      Diabetes Maternal Grandmother      Heart disease Maternal Grandfather       Social History     Tobacco Use    Smoking status: Never    Smokeless  tobacco: Never   Vaping Use    Vaping Use: Never used   Substance Use Topics    Alcohol use: Yes     Comment: 1-2 beers during the week    Drug use: Never       Objective   Visit Vitals  BP 120/86   Pulse 74   Ht 1.702 m   Wt 74.9 kg   SpO2 99%   BMI 25.87 kg/m   OB Status Continuous Birth Control   BSA 1.88 m       Physical Exam  Vitals reviewed.   Constitutional:       General: She is not in acute distress.     Appearance: Normal appearance. She is not ill-appearing or toxic-appearing.   HENT:      Nose: Nose normal.      Mouth/Throat:      Mouth: Mucous membranes are moist.      Pharynx: Oropharynx is clear.   Eyes:      Conjunctiva/sclera: Conjunctivae normal.      Pupils: Pupils are equal, round, and reactive to light.   Cardiovascular:      Rate and Rhythm: Normal rate and regular rhythm.  Pulmonary:      Effort: Pulmonary effort is normal.      Breath sounds: Normal breath sounds.   Abdominal:      General: Abdomen is flat. Bowel sounds are normal.      Palpations: Abdomen is soft. There is no mass.      Tenderness: There is abdominal tenderness in the right lower quadrant, suprapubic area and left lower quadrant. There is no right CVA tenderness or left CVA tenderness.      Comments: Tenderness present with palpation of RLQ, LLQ and suprapubic area   Neurological:      Mental Status: She is alert.         Assessment/Plan   Sheila Rogers was seen today for abdominal pain.  Suprapubic abdominal pain  Comments:  ? related to menses. no s/s of acute abdomen on exam. will repeat transvaginal US.? PID vs interstitial cystitis.  Orders:  -     US PELVIC NON OB WITH TRANSVAGINAL; Future  Urinary frequency  Comments:  given increase in frequency and urgency will do UA and culture. Avoid bladder irritants. Trial NSAIDs, tylenol, heating pad for current discomfort  Orders:  -     POCT urinalysis dipstick  -     Urinalysis reflex to culture; Future  Other orders  -     REFLEXIVE URINE CULTURE    Patient not in acute  distress in exam room, no s/s of acute abdomen present on exam. VSS. Trial heating pad, NSAIDs and tylenol for current discomfort. Increase fluid intake and fiber in her diet. Advised to trial metamucil.     >30 mins spent reviewing medical records, obtaining history, performing physical exam, independently interpreting results, communicating results and counseling/educating the patient, documenting in the EMR, and coordinating care.

## 2023-07-09 LAB — URINALYSIS REFLEX TO CULTURE
BACTERIA: NONE SEEN /HPF
BILIRUBIN: NEGATIVE
GLUCOSE: NEGATIVE
HYALINE CAST: NONE SEEN /LPF
KETONES: NEGATIVE
LEUKOCYTE ESTERASE: NEGATIVE
NITRITE: NEGATIVE
OCCULT BLOOD: NEGATIVE
PH: 6.5 (ref 5.0–8.0)
PROTEIN: NEGATIVE
RBC: NONE SEEN /HPF (ref <=2)
SPECIFIC GRAVITY: 1.005 (ref 1.001–1.035)
WBC: NONE SEEN /HPF (ref <=5)

## 2023-07-09 LAB — REFLEXIVE URINE CULTURE

## 2023-07-10 ENCOUNTER — Inpatient Hospital Stay: Admit: 2023-07-10 | Payer: BLUE CROSS/BLUE SHIELD | Primary: Internal Medicine

## 2023-07-10 DIAGNOSIS — R102 Pelvic and perineal pain: Secondary | ICD-10-CM

## 2023-08-11 NOTE — Telephone Encounter (Signed)
Last Visit:       07/08/23  Next Visit:       none  Last Labs:       Last Filled:   07/01/23 30x0

## 2023-08-11 NOTE — Telephone Encounter (Signed)
Called pt and she said she is taking both (20mg  and 10mg ) she takes them together.

## 2023-08-11 NOTE — Telephone Encounter (Signed)
Need dose confirmation from patient please.  Listed as both 20 mg and 10 mg or does she take both?

## 2024-03-10 NOTE — Patient Instructions (Signed)
 Noted.

## 2024-03-10 NOTE — Patient Instructions (Signed)
 Pt scheduled for 05/07 with LM-first available pt was able to due-shes a teacher later the better

## 2024-03-10 NOTE — Telephone Encounter (Signed)
 Before refill can be addressed patient needs a follow up appointment.

## 2024-03-10 NOTE — Telephone Encounter (Signed)
 Last Visit:  07/08/23       Next Visit: no upcoming appt         Last Labs: 2024      Last Filled:  08/11/23 90 days 3 refills change of pharmacy             Controlled Substances  Contract on file?               Expiration Date:

## 2024-03-10 NOTE — Telephone Encounter (Signed)
 Refill will be sent in to hold patient over until upcoming appointment.

## 2024-03-10 NOTE — Telephone Encounter (Signed)
 Detailed portal msg sent.

## 2024-03-10 NOTE — Telephone Encounter (Signed)
 Booked for 04/20/24 with LM

## 2024-04-20 ENCOUNTER — Ambulatory Visit: Admit: 2024-04-20 | Discharge: 2024-04-20 | Payer: BLUE CROSS/BLUE SHIELD | Primary: Internal Medicine

## 2024-04-20 DIAGNOSIS — Z0000 Encounter for general adult medical examination without abnormal findings: Secondary | ICD-10-CM

## 2024-04-20 MED ORDER — desogestreL-ethinyl estradioL (Apri) 0.15-0.03 mg tablet
0.15-0.03 | ORAL_TABLET | Freq: Every day | ORAL | 0 refills | 84.00000 days | Status: DC
Start: 2024-04-20 — End: 2024-05-16

## 2024-04-20 NOTE — Assessment & Plan Note (Signed)
 Same as above

## 2024-04-20 NOTE — Assessment & Plan Note (Signed)
 On prozac. Denies s/i h/i avh. Discussed psychology today for therapist. Self care encouraged. Exercise and getting outside reviewed.

## 2024-04-20 NOTE — Progress Notes (Cosign Needed)
 Chief Complaint:  Chief Complaint   Patient presents with    Annual Exam           Subjective:     Sheila Rogers is a 28 y.o. female who is here today for her annual comprehensive exam. Meds as per list last labs reviewed. In monogamous relationship. Lives with roommates in [place].  Works as Insurance account manager. Mood stable with prozac use, not followed by therapist. Requesting OCP, was on POP due to history of migraines, constant breakthrough bleeding, unable to tolerate. Pap up to date. Requesting STI testing.     1) Current concerns/Follow-up:  - Menses/birth control:   Patient's last menstrual period was 03/29/2024.      Review Of Systems    Constitutional: Negative for fevers, chills, fatigue, unexpected weight changes.  HEENT: Negative for visual changes, oral lesions, sore throat, epistaxis, rhinorrhea.  Cardiovascular: Negative for chest pain, DOE, palpitations, lower extremity edema.  Respiratory: Negative for dyspnea, cough, or hemoptysis.  Gastrointestinal: Negative for nausea, vomiting, abdominal pain, change in bowel habits, blood or melena.  Genitourinary: Negative for dysuria, hematuria, frequency, genital discharge/lesions.  Breast: Negative for rash, asymmetry, lumps, pain, nipple discharge.  Musculoskeletal: Negative for muscle pain/weakness, joint pain/swelling, decreased range of motion.  Integumentary: Negative for rashes, pruritus, lesions or masses.   Neurological: Negative for headaches, dizziness, numbness, tingling.  Psychiatric: Negative for depressed mood, insomnia, decreased appetite.  Endocrine: Negative for polyuria, polydipsia, weight change, temperature intolerance, hair loss.  Hematologic/Lymphatic: Negative for easy bleeding or bruising, swelling of the extremities.      Current Medications[1]    Allergies: Celexa [citalopram]    Medical History[2]    Surgical History[3]    Family History[4]      Objective:     Previous pertinent lab results:  Lab Results   Component  Value Date    HDL 54 04/01/2023    LDL 69 04/01/2023    TRIG 46 04/01/2023      No results found for: "WBC", "HGB", "HCT", "PLTCOUNTQUES", "INR"  Lab Results   Component Value Date    NA 137 04/01/2023    K 4.7 04/01/2023    BUN 11 04/01/2023    EGFR 125 04/01/2023    AST 14 04/01/2023    ALT 9 04/01/2023    ALKPHOS 58 04/01/2023    PROT 7.0 04/01/2023    ALBUMIN 4.1 04/01/2023     No results found for: "HGBA1C", "TSH", "FREET4", "MICROALBUR", "VITD25OH", "PSA"      Vitals:    04/20/24 1622   BP: 126/82   Pulse: 76   SpO2: 98%   Weight: 80.5 kg   Height: 1.702 m     Body mass index is 27.78 kg/m.    Physical Exam:  General Appearance: Pleasant, no acute distress, well developed, well nourished.  HEENT: Head is atraumatic and normocephalic. EOMI. Sclerae are anicteric. Conjunctivae are not injected.  Neck: Supple, no adenopathy, no thyromegaly  Back: FROM  Lungs: clear to auscultation bilaterally , no wheezes/rhonchi/rales , regular breathing rate and effort  Heart: Regular rate and rhythm, S1 and S2 normal, no murmur, rub or gallop  Breast Exam: bilat no skin changes, no dimpling, no lumps, nipples unremarkable, no axillary lymphadenopathy  Abdomen: Soft, non-tender, bowel sounds active all four quadrants, no masses, no organomegaly  Genitalia: deferred exam   Extremities:  no cyanosis or edema  Pulses: 2+ and symmetric throughout  Skin: Turgor normal, no rashes or suspicious lesions  Neurologic: A+Ox3, CNII-XII  grossly intact, normal strength, sensation and reflexes throughout    Patient Health Questionnaire-9 Score: 6   Interpretation: Positive screening.     Follow-up & Interventions: Other interventions or treatment for depression and Pharmacological intervention    Over the last 2 weeks, how often have you been bothered by the following problems?  Feeling Nervous, Anxious, or on Edge: More than half the days  Not Being Able to Stop or Control Worrying: More than half the days  Worrying too Much About Different  Things: More than half the days  Trouble Relaxing: More than half the days  Being so Restless That it is Hard to Sit Still: Several days  Becoming Easily Annoyed or Irritable: More than half the days  Feeling Afraid as if Something Awful Might Happen: More than half the days  GAD-7 Total Score: 13  If you checked off any problems, how difficult have these problems made it for you to do your work, take care of things at home, or get along with other people?: Somewhat difficult      Assessment and Plan:     Assessment & Plan  Encounter for annual physical exam  Immunizations, screenings, diet and exercise reviewed.   Orders:    Comprehensive metabolic panel    Lipid panel    GAD (generalized anxiety disorder)   On prozac. Denies s/i h/i avh. Discussed psychology today for therapist. Self care encouraged. Exercise and getting outside reviewed.        Mild depressive disorder   Same as above.        Immunization due  Tetanus given  Orders:    Tdap vaccine greater than or equal to 36 years old IM    Screening examination for STI  GC testing ordered  Orders:    GC/CT (Amp DNA); Future    Encounter for initial prescription of contraceptive pills  Trial ocp. Aware to connect if abdominal pain, chest pain, headaches, eye problems, and severe calf or thigh pain since starting OCP.      Orders:    desogestreL-ethinyl estradioL ([place]) 0.15-0.03 mg tablet; Take 1 tablet by mouth once daily.        Preventive Counseling Discussed:  --Nutrition/Weight Management: discussed caffeine intake, sufficient intake of fresh fruits, vegetables  --Exercise: discussed importance of regular exercise.   --Substance Abuse: Discussed primary prevention of tobacco, alcohol, or other drug use;   --Injury prevention: Discussed safety belts  --Dental health: Discussed importance of regular  dental visits. As well as eye exams   --Immunizations reviewed.  Discussed behaviors, sleep, mental health  Knows we will connect via the portal for any testing  and lab follow up  Follow-Up:  Follow up for 1 month telehealth w/ me. 1 year CPE.        Aldo Amble, NP         [1]   Current Outpatient Medications   Medication Sig Dispense Refill    desogestreL-ethinyl estradioL (Apri) 0.15-0.03 mg tablet Take 1 tablet by mouth once daily. 30 tablet 0    FLUoxetine (PROzac) 10 mg capsule Take 1 capsule (10 mg) by mouth once daily. (Takes with 20 mg dose) 90 capsule 3    FLUoxetine (PROzac) 20 mg tablet Take 1 tablet (20 mg) by mouth once daily. 90 tablet 0     No current facility-administered medications for this visit.   [2]   Past Medical History:  Diagnosis Date    Dermatosis     Hidradenitis suppurativa  Raynaud's phenomenon    [3] No past surgical history on file.  [4]   Family History  Problem Relation Name Age of Onset    No Known Problems Mother      Diabetes Father      Asthma Brother      Diabetes Maternal Grandmother      Heart disease Maternal Grandfather      Alzheimer's disease Paternal Grandfather

## 2024-04-22 LAB — GC/CT (AMP DNA)
CHLAMYDIA TRACHOMATIS RNA, TMA, UROGENITAL: NOT DETECTED
NEISSERIA GONORRHOEAE RNA, TMA, UROGENITAL: NOT DETECTED

## 2024-05-16 NOTE — Telephone Encounter (Signed)
 Last Visit:  04/20/2024       Next Visit:       06/06/2024  Last Labs:       Last Filled:   04/20/2024

## 2024-06-06 ENCOUNTER — Encounter: Admit: 2024-06-06 | Discharge: 2024-06-06 | Payer: BLUE CROSS/BLUE SHIELD | Primary: Internal Medicine

## 2024-06-06 DIAGNOSIS — Z3041 Encounter for surveillance of contraceptive pills: Secondary | ICD-10-CM

## 2024-06-06 NOTE — Progress Notes (Signed)
 [facility]  [address]  [place] [address]  Dept: [phone]  Dept Fax: [phone]     Patient ID: Sheila Rogers is a 28 y.o. female who presents for Contraception.    Subjective   HPI  28 y.o. with medical history as below presents today for follow up. Started COC pill at annual exam due to breakthrough bleeding with POP. Significant history of migraines. Did experience headaches for the first 10 days after starting COC. Relieved with OTC medications. No longer experiencing headaches. Denies abdominal pain, chest pain, headaches, eye problems, and severe calf or thigh pain since starting OCP.       Problem List[1]  Current Outpatient Medications   Medication Instructions    Apri  0.15-0.03 mg tablet 1 tablet, oral, Daily    FLUoxetine  (PROZAC ) 10 mg, oral, Daily, (Takes with 20 mg dose)    FLUoxetine  (PROZAC ) 20 mg, oral, Daily     Allergies[2]  Medical History[3]  Surgical History[4]  Family History[5]  Social History[6]    Objective   Visit Vitals  OB Status Having periods     Physical Exam  Constitutional:       Appearance: Normal appearance.      Comments: Limited exam due to telehealth encounter.    Neurological:      Mental Status: She is alert.         Assessment/Plan   Sheila Rogers was seen today for contraception.  Encounter for surveillance of contraceptive pills  Comments:  denies abdominal pain, chest pain, headaches, eye problems, and severe calf or thigh pain since starting OCP. aware to connect if sxs occur.                      This real-time, interactive virtual Telehealth encounter was done by video with the patient's verbal consent. Two patient identifiers were used and confirmed. Physical location of home to the patient was located inat the time of the visit.  Physical location of the provider: office. Other participants/involvement: none           [1]   Patient Active Problem List  Diagnosis    Dermatosis    Hidradenitis suppurativa    Facial palsy as birth trauma     Raynaud's disease without gangrene    GAD (generalized anxiety disorder)     Mild depressive disorder    [2]   Allergies  Allergen Reactions    Celexa [Citalopram]      Feelings of slowness, increased depression and anxiety.   [3]   Past Medical History:  Diagnosis Date    Dermatosis     Hidradenitis suppurativa     Raynaud's phenomenon    [4] No past surgical history on file.  [5]   Family History  Problem Relation Name Age of Onset    No Known Problems Mother      Diabetes Father      Asthma Brother      Diabetes Maternal Grandmother      Heart disease Maternal Grandfather      Alzheimer's disease Paternal Grandfather     [6]   Social History  Socioeconomic History    Marital status: Single     Spouse name: Not on file    Number of children: Not on file    Years of education: Not on file    Highest education level: Not on file   Occupational History    Occupation: Teacher   Tobacco Use  Smoking status: Never    Smokeless tobacco: Never   Vaping Use    Vaping status: Never Used   Substance and Sexual Activity    Alcohol use: Yes     Comment: 1-2 beers during the week    Drug use: Never    Sexual activity: Yes     Partners: Male     Birth control/protection: OCP, Condom Female   Other Topics Concern    Not on file   Social History Narrative    Not on file     Social Determinants of Health     Financial Resource Strain: Low Risk  (04/14/2024)    Overall Financial Resource Strain (CARDIA)     Difficulty of Paying Living Expenses: Not very hard   Food Insecurity: Unknown (04/14/2024)    Hunger Vital Sign     Worried About Running Out of Food in the Last Year: Never true     Ran Out of Food in the Last Year: Not on file   Transportation Needs: No Transportation Needs (04/14/2024)    PRAPARE - Therapist, art (Medical): No     Lack of Transportation (Non-Medical): No   Physical Activity: Insufficiently Active (04/20/2024)    Exercise Vital Sign     Days of Exercise per Week: 2 days     Minutes of  Exercise per Session: 40 min   Stress: Not on file   Social Connections: Not on file   Intimate Partner Violence: Not At Risk (12/24/2022)    Received from [facility]    Intimate Partner Violence     Are you denied basic needs such as food, clothing, or medical care?: No     In the past 12 months have you been in a relationship with a person who hurts, threatens, or tries to control you?: No     Are you denied basic needs such as food, clothing, or medical care?: No     In the past 12 months have you been in a relationship with a person who hurts, threatens, or tries to control you?: No   Housing Stability: Unknown (04/14/2024)    Housing Stability Vital Sign     Unable to Pay for Housing in the Last Year: Not on file     Number of Times Moved in the Last Year: Not on file     Homeless in the Last Year: No

## 2024-06-22 LAB — COMPREHENSIVE METABOLIC PANEL
ALT: 11 IU/L (ref 0–32)
AST: 14 IU/L (ref 0–40)
Albumin: 4.5 g/dL (ref 4.0–5.0)
Alk Phosphatase: 73 IU/L (ref 44–121)
Anion Gap: 12 mmol/L (ref 10.0–18.0)
BUN/Creat Ratio: 11 (ref 9–23)
BUN: 10 mg/dL (ref 6–20)
Bili Total: 0.8 mg/dL (ref 0.0–1.2)
Calcium: 9.7 mg/dL (ref 8.7–10.2)
Carbon Dioxide: 23 mmol/L (ref 20–29)
Chloride: 102 mmol/L (ref 96–106)
Creat: 0.9 mg/dL (ref 0.57–1.00)
Globulin Total: 3.1 g/dL (ref 1.5–4.5)
Glucose: 90 mg/dL (ref 70–99)
Potassium: 4.8 mmol/L (ref 3.5–5.2)
Protein Total: 7.6 g/dL (ref 6.0–8.5)
Sodium: 137 mmol/L (ref 134–144)
eGFR: 91 mL/min/1.73 (ref >59)

## 2024-06-22 LAB — LIPID PANEL
Cholesterol: 188 mg/dL (ref 100–199)
HDL Cholesterol: 67 mg/dL (ref >39)
LDLc Calc (NIH): 109 mg/dL — ABNORMAL HIGH (ref 0–99)
Triglycerides: 63 mg/dL (ref 0–149)
VLDLc Calc: 12 mg/dL (ref 5–40)

## 2024-09-06 NOTE — Telephone Encounter (Signed)
 Last Visit:  04/20/24       Next Visit: 05/02/25        Last Labs:       Last Filled:   03/10/24  #[id number]
